# Patient Record
Sex: Female | Born: 1985 | Race: Asian | Hispanic: Yes | Marital: Married | Smoking: Never smoker
Health system: Southern US, Community
[De-identification: ages and names within clinical notes are randomized; demographics above are authoritative.]

## PROBLEM LIST (undated history)

## (undated) DIAGNOSIS — K219 Gastro-esophageal reflux disease without esophagitis: Secondary | ICD-10-CM

## (undated) DIAGNOSIS — M419 Scoliosis, unspecified: Secondary | ICD-10-CM

## (undated) DIAGNOSIS — K8020 Calculus of gallbladder without cholecystitis without obstruction: Secondary | ICD-10-CM

## (undated) DIAGNOSIS — Z789 Other specified health status: Secondary | ICD-10-CM

## (undated) HISTORY — PX: CHOLECYSTECTOMY: SHX55

## (undated) HISTORY — PX: NO PAST SURGERIES: SHX2092

---

## 2017-03-20 ENCOUNTER — Encounter (HOSPITAL_COMMUNITY): Payer: Self-pay | Admitting: Emergency Medicine

## 2017-03-20 ENCOUNTER — Ambulatory Visit (HOSPITAL_COMMUNITY)
Admission: EM | Admit: 2017-03-20 | Discharge: 2017-03-20 | Disposition: A | Discharge status: Home / Self Care | Attending: Family Medicine | Admitting: Family Medicine

## 2017-03-20 DIAGNOSIS — R519 Headache, unspecified: Secondary | ICD-10-CM

## 2017-03-20 DIAGNOSIS — J00 Acute nasopharyngitis [common cold]: Secondary | ICD-10-CM | POA: Diagnosis not present

## 2017-03-20 DIAGNOSIS — R0981 Nasal congestion: Secondary | ICD-10-CM | POA: Diagnosis not present

## 2017-03-20 DIAGNOSIS — R51 Headache: Secondary | ICD-10-CM

## 2017-03-20 MED ORDER — PREDNISONE 10 MG (21) PO TBPK: ORAL_TABLET | Freq: Every day | ORAL | 0 refills | Status: DC

## 2017-03-20 MED ORDER — KETOROLAC TROMETHAMINE 30 MG/ML IJ SOLN
INTRAMUSCULAR | Status: AC
  Filled 2017-03-20: qty 1

## 2017-03-20 MED ORDER — KETOROLAC TROMETHAMINE 30 MG/ML IJ SOLN
30.0000 mg | Freq: Once | INTRAMUSCULAR | Status: AC
  Administered 2017-03-20: 30 mg via INTRAMUSCULAR

## 2017-03-20 MED ORDER — FLUTICASONE PROPIONATE 50 MCG/ACT NA SUSP: 2.0000 | Freq: Every day | NASAL | 2 refills | Status: DC

## 2017-03-20 NOTE — ED Provider Notes (Signed)
CSN: [id number]     Arrival date & time 03/20/17  1907 History   First MD Initiated Contact with Patient 03/20/17 2003     Chief Complaint  Patient presents with  . Facial Pain   (Consider location/radiation/quality/duration/timing/severity/associated sxs/prior Treatment) Pt c/o generalized facial pain more so near sinus area, sore throat and headache. She states that she has had this in the past from a sinus infection. This time it has only been for 2 days. She states that she is breast feeding and not able to take many medications and has not taken any pta. Denies ay photophobia, no n/v/d, no weakness, alert.       History reviewed. No pertinent past medical history. Past Surgical History:  Procedure Laterality Date  . CHOLECYSTECTOMY     History reviewed. No pertinent family history. Social History  Substance Use Topics  . Smoking status: Never Smoker  . Smokeless tobacco: Never Used  . Alcohol use No   OB History    No data available     Review of Systems  Constitutional: Negative.   HENT: Positive for postnasal drip, rhinorrhea, sinus pain, sinus pressure, sneezing and sore throat.   Eyes: Negative.   Respiratory: Negative.   Cardiovascular: Negative.   Gastrointestinal: Negative.   Genitourinary: Negative.   Musculoskeletal: Negative.   Neurological: Positive for headaches.    Allergies  Aspirin  Home Medications   Prior to Admission medications   Not on File   Meds Ordered and Administered this Visit   Medications  ketorolac (TORADOL) 30 MG/ML injection 30 mg (not administered)    BP 111/69 (BP Location: Right Arm)   Pulse 81   Temp 98.5 F (36.9 C) (Oral)   Resp 18   SpO2 99%  No data found.   Physical Exam  Constitutional: She appears well-developed.  HENT:  Head: Normocephalic.  Right Ear: External ear normal.  Left Ear: External ear normal.  Mouth/Throat: Oropharynx is clear and moist.  clear post nasal drip   Eyes: Pupils are equal,  round, and reactive to light.  Neck: Normal range of motion.  Cardiovascular: Normal rate and regular rhythm.   Pulmonary/Chest: Effort normal and breath sounds normal.  Abdominal: Soft. Bowel sounds are normal.  Musculoskeletal: Normal range of motion.  Neurological: She is alert.  No aura, no photopia, no n/v/d has had a headache like this in the past.   Skin: Skin is warm.    Urgent Care Course     Procedures (including critical care time)  Labs Review Labs Reviewed - No data to display  Imaging Review No results found.           MDM   1. Sinus congestion   2. Acute nonintractable headache, unspecified headache type   3. Acute rhinitis    Since you are breast feeding there is not a lot of medications that are available to you  We gave you a shot for pain and she states the pain is better 4/10 If symptoms of your headache get worse such as blurred vision weakness to one side you will need to go to the emergency room Drink plenty of fluids  May use a netty pot to help with sinus pressure  May take tylenol as needed for pain   spoke with doc Dayton ScrapeMurray and she was ok with treatment plan. Pt was very hesitant on taking prednisone due to she became nausea prior while taking this. Given much education on this and pt was ok upon  d/c    Tobi Bastos, NP 03/22/17 [phone]

## 2017-03-20 NOTE — Discharge Instructions (Signed)
Since you are breast feeding there is not a lot of medications that are available to you  We will gave you a shot to help a If symptoms of your headache get worse such as blurred vision weakness to one side you will need to go to the emergency room Drink plenty of fluids  May use a netty pot to help with sinus pressure  May take tylenol as needed for pain

## 2017-03-20 NOTE — ED Triage Notes (Signed)
[facility] with a [facility] of a sore throat, bilateral otalgia and sinus pressure that started this am.

## 2017-04-19 ENCOUNTER — Ambulatory Visit (HOSPITAL_COMMUNITY)
Admission: EM | Admit: 2017-04-19 | Discharge: 2017-04-19 | Disposition: A | Discharge status: Home / Self Care | Attending: Family Medicine | Admitting: Family Medicine

## 2017-04-19 ENCOUNTER — Encounter (HOSPITAL_COMMUNITY): Payer: Self-pay | Admitting: Emergency Medicine

## 2017-04-19 DIAGNOSIS — R3915 Urgency of urination: Secondary | ICD-10-CM | POA: Diagnosis present

## 2017-04-19 DIAGNOSIS — Z9049 Acquired absence of other specified parts of digestive tract: Secondary | ICD-10-CM | POA: Insufficient documentation

## 2017-04-19 DIAGNOSIS — R300 Dysuria: Secondary | ICD-10-CM | POA: Diagnosis present

## 2017-04-19 DIAGNOSIS — N3000 Acute cystitis without hematuria: Secondary | ICD-10-CM | POA: Diagnosis not present

## 2017-04-19 DIAGNOSIS — B373 Candidiasis of vulva and vagina: Secondary | ICD-10-CM | POA: Diagnosis not present

## 2017-04-19 DIAGNOSIS — Z87440 Personal history of urinary (tract) infections: Secondary | ICD-10-CM | POA: Insufficient documentation

## 2017-04-19 DIAGNOSIS — Z3202 Encounter for pregnancy test, result negative: Secondary | ICD-10-CM | POA: Diagnosis not present

## 2017-04-19 DIAGNOSIS — N390 Urinary tract infection, site not specified: Secondary | ICD-10-CM | POA: Diagnosis present

## 2017-04-19 DIAGNOSIS — B3731 Acute candidiasis of vulva and vagina: Secondary | ICD-10-CM

## 2017-04-19 LAB — POCT URINALYSIS DIP (DEVICE)
Bilirubin Urine: NEGATIVE
Glucose, UA: NEGATIVE mg/dL
KETONES UR: NEGATIVE mg/dL
Nitrite: NEGATIVE
PH: 6.5 (ref 5.0–8.0)
PROTEIN: NEGATIVE mg/dL
Specific Gravity, Urine: 1.015 (ref 1.005–1.030)
Urobilinogen, UA: 0.2 mg/dL (ref 0.0–1.0)

## 2017-04-19 LAB — POCT PREGNANCY, URINE: PREG TEST UR: NEGATIVE

## 2017-04-19 MED ORDER — CEPHALEXIN 500 MG PO CAPS: 500.0000 mg | ORAL_CAPSULE | Freq: Four times a day (QID) | ORAL | 0 refills | Status: DC

## 2017-04-19 MED ORDER — PHENAZOPYRIDINE HCL 200 MG PO TABS: 200.0000 mg | ORAL_TABLET | Freq: Three times a day (TID) | ORAL | 0 refills | Status: DC | PRN

## 2017-04-19 MED ORDER — FLUCONAZOLE 200 MG PO TABS
ORAL_TABLET | ORAL | 0 refills | Status: DC
Start: 2017-04-19 — End: 2017-11-30

## 2017-04-19 NOTE — ED Notes (Signed)
Sent patient to bathroom with instructions for obtaining a dirty and clean urine

## 2017-04-19 NOTE — Discharge Instructions (Signed)
You are being treated today for a urinary tract infection. I have prescribed Keflex, take 1 tablet 4 times a day for 5 days. I have also prescribed Pyridium. Take 1 tablet a day 3 times a day for 2 days. Your urine will be sent for culture and you will be notified should any change in therapy be needed. Drink plenty of fluids and rest. Should your symptoms fail to resolve, follow up with your primary care provider or return to clinic.   I have also provided the contact information for 2 different clinics, the first [facility], contact him to establish for primary care. The second is the [facility] outpatient clinic, contact them to establish for gynecological care.

## 2017-04-19 NOTE — ED Triage Notes (Signed)
Pain with urination for 3 days.  Low abdominal pain, painful intercourse.

## 2017-04-19 NOTE — ED Provider Notes (Signed)
CSN: [id number]     Arrival date & time 04/19/17  1439 History   First MD Initiated Contact with Patient 04/19/17 1500     Chief Complaint  Patient presents with  . Urinary Tract Infection   (Consider location/radiation/quality/duration/timing/severity/associated sxs/prior Treatment) Valerie Frost is a 31 y.o. female with a past history of cholecystectomy, who presents [facility] with a chief complaint of dysuria, urgency, frequency, as well as vaginal itching, denies any vaginal discharge, last menstrual cycle 10/30/2016, denies possibility of pregnancy.   The history is provided by the patient.  Urinary Tract Infection  Pain quality:  Aching and burning Pain severity:  Moderate Onset quality:  Gradual Duration:  2 days Timing:  Constant Progression:  Unchanged Chronicity:  New Recent urinary tract infections: no   Relieved by:  None tried Worsened by:  Nothing Ineffective treatments:  None tried Urinary symptoms: discolored urine and frequent urination   Urinary symptoms: no bladder incontinence   Associated symptoms: nausea   Associated symptoms: no abdominal pain, no fever, no flank pain, no genital lesions, no vaginal discharge and no vomiting   Risk factors: sexually active   Risk factors: not pregnant, no recurrent urinary tract infections and no sexually transmitted infections     History reviewed. No pertinent past medical history. Past Surgical History:  Procedure Laterality Date  . CHOLECYSTECTOMY     No family history on file. Social History  Substance Use Topics  . Smoking status: Never Smoker  . Smokeless tobacco: Never Used  . Alcohol use No   OB History    No data available     Review of Systems  Constitutional: Negative for fever.  HENT: Negative.   Respiratory: Negative.   Cardiovascular: Negative.   Gastrointestinal: Positive for nausea. Negative for abdominal pain and vomiting.  Genitourinary: Positive for dysuria, frequency  and urgency. Negative for flank pain, genital sores, menstrual problem, pelvic pain and vaginal discharge.       Vaginal itching  Musculoskeletal: Negative.   Skin: Negative.   Neurological: Negative.     Allergies  Aspirin  Home Medications   Prior to Admission medications   Medication Sig Start Date End Date Taking? Authorizing Provider  cephALEXin (KEFLEX) 500 MG capsule Take 1 capsule (500 mg total) by mouth 4 (four) times daily. 04/19/17   Dorena Bodo, NP  fluconazole (DIFLUCAN) 200 MG tablet Take one tablet today, wait 3 days, take the second tablet 04/19/17   Dorena Bodo, NP  phenazopyridine (PYRIDIUM) 200 MG tablet Take 1 tablet (200 mg total) by mouth 3 (three) times daily as needed for pain. 04/19/17   Dorena Bodo, NP   Meds Ordered and Administered this Visit  Medications - No data to display  BP 106/66 (BP Location: Right Arm)   Pulse 80   Temp 98.5 F (36.9 C) (Oral)   Resp 16   LMP 03/30/2017   SpO2 100%  No data found.   Physical Exam  Constitutional: She is oriented to person, place, and time. She appears well-developed and well-nourished. No distress.  Eyes: Conjunctivae are normal.  Neck: Normal range of motion.  Cardiovascular: Normal rate and regular rhythm.   Pulmonary/Chest: Effort normal.  Abdominal: Soft. Bowel sounds are normal. She exhibits no distension and no mass. There is no tenderness. There is no guarding and no CVA tenderness.  Neurological: She is alert and oriented to person, place, and time.  Skin: Skin is warm and dry. Capillary refill takes less  than 2 seconds. She is not diaphoretic.  Psychiatric: She has a normal mood and affect. Her behavior is normal.  Nursing note and vitals reviewed.   Urgent Care Course     Procedures (including critical care time)  Labs Review Labs Reviewed  POCT URINALYSIS DIP (DEVICE) - Abnormal; Notable for the following:       Result Value   Hgb urine dipstick TRACE (*)     Leukocytes, UA SMALL (*)    All other components within normal limits  POCT PREGNANCY, URINE  URINE CYTOLOGY ANCILLARY ONLY    Imaging Review No results found.     MDM   1. Acute cystitis without hematuria   2. Yeast vaginitis      Valerie Frost is a 31 y.o. female here with urinary urgency, frequency, and dysuria, Along with vaginal itching.  Differential diagnosis for urinary symptoms includes, but is not limited to, UTI, pyelonephritis, ureterallithiasis, STI, reaction to hygiene products, and others.  VSS. Exam without any abdominal or CVAT. Pt is afebrile & has not been vomiting. UA is possibly sugestive of UTI. Will treat with Keflex and Pyridium. Urine cytology sent, and patient given Diflucan as well. Follow up with PCP in 3-5 days for recheck as needed. If symptoms worsen, develop back pain/fever/vomiting return to the urgent care, or go to the ER for further treatment.     Dorena BodoKennard, Tylique Aull, NP 04/19/17 1541

## 2017-04-20 LAB — URINE CYTOLOGY ANCILLARY ONLY
CHLAMYDIA, DNA PROBE: NEGATIVE
NEISSERIA GONORRHEA: NEGATIVE
Trichomonas: NEGATIVE

## 2017-05-23 ENCOUNTER — Encounter: Payer: Self-pay | Admitting: Family Medicine

## 2017-10-15 ENCOUNTER — Encounter: Payer: Self-pay | Admitting: Obstetrics and Gynecology

## 2017-11-02 ENCOUNTER — Encounter (HOSPITAL_COMMUNITY): Payer: Self-pay | Admitting: Emergency Medicine

## 2017-11-02 ENCOUNTER — Ambulatory Visit (HOSPITAL_COMMUNITY)
Admission: EM | Admit: 2017-11-02 | Discharge: 2017-11-02 | Disposition: A | Discharge status: Home / Self Care | Attending: Emergency Medicine | Admitting: Emergency Medicine

## 2017-11-02 DIAGNOSIS — R1032 Left lower quadrant pain: Secondary | ICD-10-CM

## 2017-11-02 DIAGNOSIS — R300 Dysuria: Secondary | ICD-10-CM | POA: Diagnosis not present

## 2017-11-02 DIAGNOSIS — Z9049 Acquired absence of other specified parts of digestive tract: Secondary | ICD-10-CM | POA: Insufficient documentation

## 2017-11-02 DIAGNOSIS — R102 Pelvic and perineal pain: Secondary | ICD-10-CM | POA: Insufficient documentation

## 2017-11-02 DIAGNOSIS — Z3202 Encounter for pregnancy test, result negative: Secondary | ICD-10-CM | POA: Diagnosis not present

## 2017-11-02 LAB — POCT URINALYSIS DIP (DEVICE)
BILIRUBIN URINE: NEGATIVE
Glucose, UA: NEGATIVE mg/dL
KETONES UR: NEGATIVE mg/dL
LEUKOCYTES UA: NEGATIVE
NITRITE: NEGATIVE
Protein, ur: NEGATIVE mg/dL
Specific Gravity, Urine: 1.015 (ref 1.005–1.030)
Urobilinogen, UA: 0.2 mg/dL (ref 0.0–1.0)
pH: 5.5 (ref 5.0–8.0)

## 2017-11-02 LAB — POCT PREGNANCY, URINE: Preg Test, Ur: NEGATIVE

## 2017-11-02 NOTE — ED Triage Notes (Signed)
PT reports pelvic pain, lower abdominal , dysuria for 1 week. PT reports fever 2 days ago.

## 2017-11-02 NOTE — ED Provider Notes (Signed)
[facility]    CSN: [id number] Arrival date & time: 11/02/17  1449     History   Chief Complaint Chief Complaint  Patient presents with  . Urinary Tract Infection    HPI Valerie Frost is a 32 y.o. female.   Here for lower pelvic pain since being on her menstrual cycle , low grade fever , nausea. Concern for uti sx, denies any possible risks of preg. Has only taken tylenol pta with minimal relief       History reviewed. No pertinent past medical history.  There are no active problems to display for this patient.   Past Surgical History:  Procedure Laterality Date  . CHOLECYSTECTOMY      OB History    No data available       Home Medications    Prior to Admission medications   Medication Sig Start Date End Date Taking? Authorizing Provider  cephALEXin (KEFLEX) 500 MG capsule Take 1 capsule (500 mg total) by mouth 4 (four) times daily. 04/19/17   Dorena BodoKennard, Lawrence, NP  fluconazole (DIFLUCAN) 200 MG tablet Take one tablet today, wait 3 days, take the second tablet 04/19/17   Dorena BodoKennard, Lawrence, NP  phenazopyridine (PYRIDIUM) 200 MG tablet Take 1 tablet (200 mg total) by mouth 3 (three) times daily as needed for pain. 04/19/17   Dorena BodoKennard, Lawrence, NP    Family History No family history on file.  Social History Social History   Tobacco Use  . Smoking status: Never Smoker  . Smokeless tobacco: Never Used  Substance Use Topics  . Alcohol use: No  . Drug use: No     Allergies   Aspirin   Review of Systems Review of Systems  Respiratory: Negative.   Cardiovascular: Negative.   Gastrointestinal: Negative.   Genitourinary: Positive for menstrual problem and pelvic pain.  Musculoskeletal: Negative.   Skin: Negative.   Neurological: Negative.      Physical Exam Triage Vital Signs ED Triage Vitals  Enc Vitals Group     BP 11/02/17 1532 105/71     Pulse Rate 11/02/17 1530 74     Resp 11/02/17 1530 16     Temp 11/02/17 1530 98.3 F (36.8 C)       Temp Source 11/02/17 1530 Oral     SpO2 11/02/17 1530 100 %     Weight 11/02/17 1531 147 lb (66.7 kg)     Height --      Head Circumference --      Peak Flow --      Pain Score 11/02/17 1532 8     Pain Loc --      Pain Edu? --      Excl. in GC? --    No data found.  Updated Vital Signs BP 105/71   Pulse 74   Temp 98.3 F (36.8 C) (Oral)   Resp 16   Wt 147 lb (66.7 kg)   LMP 10/31/2017   SpO2 100%   Visual Acuity Right Eye Distance:   Left Eye Distance:   Bilateral Distance:    Right Eye Near:   Left Eye Near:    Bilateral Near:     Physical Exam  Constitutional: She appears well-developed.  Cardiovascular: Normal rate and regular rhythm.  Pulmonary/Chest: Effort normal and breath sounds normal.  Abdominal: There is tenderness.  Neg for cv tenderness   Neurological: She is alert.  Skin: Skin is warm.     UC Treatments / Results  Labs (all labs  ordered are listed, but only abnormal results are displayed) Labs Reviewed  POCT URINALYSIS DIP (DEVICE) - Abnormal; Notable for the following components:      Result Value   Hgb urine dipstick LARGE (*)    All other components within normal limits  URINE CULTURE  POCT PREGNANCY, URINE    EKG  EKG Interpretation None       Radiology No results found.  Procedures Procedures (including critical care time)  Medications Ordered in UC Medications - No data to display   Initial Impression / Assessment and Plan / UC Course  I have reviewed the triage vital signs and the nursing notes.  Pertinent labs & imaging results that were available during my care of the patient were reviewed by me and considered in my medical decision making (see chart for details).     Your preg test was negative  We discussed you seeing an obgyn in feb  Discussed to call to see about an outpt ultrasound completed  You are currently on your menstrual cycle and this can cause some of the discomfort  Take the pain meds as  needed  Your uirne did not show any infection currently we will send this off for a culture   Final Clinical Impressions(s) / UC Diagnoses   Final diagnoses:  Dysuria  Left lower quadrant pain    ED Discharge Orders    None       Controlled Substance Prescriptions [place] Controlled Substance Registry consulted? Not Applicable   Coralyn Mark, NP 11/02/17 [phone]

## 2017-11-02 NOTE — Discharge Instructions (Signed)
Your preg test was negative  We discussed you seeing an obgyn in feb  Discussed to call to see about an outpt ultrasound completed  You are currently on your menstrual cycle and this can cause some of the discomfort  Take the pain meds as needed  Your uirne did not show any infection currently we will send this off for a culture  Due to your allergy to Asprin we are unable to prescribe any other pain meds cont to take the tylenol for pain

## 2017-11-04 LAB — URINE CULTURE: Culture: NO GROWTH

## 2017-11-30 ENCOUNTER — Other Ambulatory Visit: Payer: Self-pay

## 2017-11-30 ENCOUNTER — Inpatient Hospital Stay (HOSPITAL_COMMUNITY)

## 2017-11-30 ENCOUNTER — Encounter (HOSPITAL_COMMUNITY): Payer: Self-pay

## 2017-11-30 ENCOUNTER — Inpatient Hospital Stay (HOSPITAL_COMMUNITY)
Admission: AD | Admit: 2017-11-30 | Discharge: 2017-11-30 | Disposition: A | Discharge status: Home / Self Care | Source: Ambulatory Visit | Attending: Obstetrics and Gynecology | Admitting: Obstetrics and Gynecology

## 2017-11-30 DIAGNOSIS — Z886 Allergy status to analgesic agent status: Secondary | ICD-10-CM | POA: Diagnosis not present

## 2017-11-30 DIAGNOSIS — R102 Pelvic and perineal pain: Secondary | ICD-10-CM | POA: Insufficient documentation

## 2017-11-30 DIAGNOSIS — R51 Headache: Secondary | ICD-10-CM | POA: Insufficient documentation

## 2017-11-30 DIAGNOSIS — G43009 Migraine without aura, not intractable, without status migrainosus: Secondary | ICD-10-CM

## 2017-11-30 HISTORY — DX: Other specified health status: Z78.9

## 2017-11-30 LAB — WET PREP, GENITAL
CLUE CELLS WET PREP: NONE SEEN
SPERM: NONE SEEN
Trich, Wet Prep: NONE SEEN
Yeast Wet Prep HPF POC: NONE SEEN

## 2017-11-30 LAB — COMPREHENSIVE METABOLIC PANEL
ALBUMIN: 3.7 g/dL (ref 3.5–5.0)
ALK PHOS: 83 U/L (ref 38–126)
ALT: 26 U/L (ref 14–54)
ANION GAP: 9 (ref 5–15)
AST: 22 U/L (ref 15–41)
BUN: 11 mg/dL (ref 6–20)
CALCIUM: 9.1 mg/dL (ref 8.9–10.3)
CO2: 20 mmol/L — AB (ref 22–32)
Chloride: 107 mmol/L (ref 101–111)
Creatinine, Ser: 0.55 mg/dL (ref 0.44–1.00)
GFR calc Af Amer: >60 mL/min (ref >60)
GFR calc non Af Amer: >60 mL/min (ref >60)
GLUCOSE: 129 mg/dL — AB (ref 65–99)
Potassium: 3.7 mmol/L (ref 3.5–5.1)
SODIUM: 136 mmol/L (ref 135–145)
Total Bilirubin: 0.3 mg/dL (ref 0.3–1.2)
Total Protein: 6.8 g/dL (ref 6.5–8.1)

## 2017-11-30 LAB — CBC
HCT: 34.8 % — ABNORMAL LOW (ref 36.0–46.0)
HEMOGLOBIN: 11.6 g/dL — AB (ref 12.0–15.0)
MCH: 28.2 pg (ref 26.0–34.0)
MCHC: 33.3 g/dL (ref 30.0–36.0)
MCV: 84.7 fL (ref 78.0–100.0)
Platelets: 432 10*3/uL — ABNORMAL HIGH (ref 150–400)
RBC: 4.11 MIL/uL (ref 3.87–5.11)
RDW: 13.4 % (ref 11.5–15.5)
WBC: 9.6 10*3/uL (ref 4.0–10.5)

## 2017-11-30 LAB — URINALYSIS, ROUTINE W REFLEX MICROSCOPIC
Bilirubin Urine: NEGATIVE
Glucose, UA: NEGATIVE mg/dL
Ketones, ur: NEGATIVE mg/dL
Nitrite: NEGATIVE
PROTEIN: NEGATIVE mg/dL
Specific Gravity, Urine: 1.006 (ref 1.005–1.030)
pH: 6 (ref 5.0–8.0)

## 2017-11-30 LAB — POCT PREGNANCY, URINE: PREG TEST UR: NEGATIVE

## 2017-11-30 MED ORDER — DEXAMETHASONE SODIUM PHOSPHATE 10 MG/ML IJ SOLN
10.0000 mg | Freq: Once | INTRAMUSCULAR | Status: AC
  Administered 2017-11-30: 10 mg via INTRAVENOUS
  Filled 2017-11-30: qty 1

## 2017-11-30 MED ORDER — SODIUM CHLORIDE 0.9 % IV BOLUS (SEPSIS)
1000.0000 mL | Freq: Once | INTRAVENOUS | Status: AC
  Administered 2017-11-30: 1000 mL via INTRAVENOUS

## 2017-11-30 MED ORDER — DIPHENHYDRAMINE HCL 50 MG/ML IJ SOLN
25.0000 mg | Freq: Once | INTRAMUSCULAR | Status: AC
  Administered 2017-11-30: 25 mg via INTRAVENOUS
  Filled 2017-11-30: qty 1

## 2017-11-30 MED ORDER — IBUPROFEN 600 MG PO TABS: 600.0000 mg | ORAL_TABLET | Freq: Four times a day (QID) | ORAL | 1 refills | Status: AC | PRN

## 2017-11-30 MED ORDER — KETOROLAC TROMETHAMINE 60 MG/2ML IM SOLN: 60.0000 mg | Freq: Once | INTRAMUSCULAR | Status: DC

## 2017-11-30 MED ORDER — METOCLOPRAMIDE HCL 5 MG/ML IJ SOLN
10.0000 mg | Freq: Once | INTRAMUSCULAR | Status: AC
  Administered 2017-11-30: 10 mg via INTRAVENOUS
  Filled 2017-11-30: qty 2

## 2017-11-30 NOTE — MAU Provider Note (Signed)
History  CSN: [id number] Arrival date and time: 11/30/17 1532  Chief Complaint  Patient presents with  . Abdominal Pain  . Headache   HPI: Valerie Frost is a 32 yo G1P1001 woman who presents to MAU for complaint of uterine pain and headache.  The patient reports a history of uterine pain over the past month.  One month ago she had 4 days of vaginal bleeding and pinkish discharge when the pain started.  She describes the pain as sharp and that it comes and goes.  She says the pain occurs when she is lifting things or walking, but it can also occur while she is sitting doing nothing.  She reports feeling fever and chills over the past month intermittently.  Over the past 2 days she reports a headache that is located all across her head.  She says she has dizziness when she looks up and has had some nausea today.  She has tried ibuprofen and Tylenol for pain which help but do not resolve the pain.  She went to urgent care when these symptoms started because she thought she may have a UTI.  UA and culture were negative at that time.  OB History    Gravida Para Term Preterm AB Living   1 1 1     1    SAB TAB Ectopic Multiple Live Births                  Past Medical History:  Diagnosis Date  . Medical history non-contributory     Past Surgical History:  Procedure Laterality Date  . CHOLECYSTECTOMY    . NO PAST SURGERIES      History reviewed. No pertinent family history.  Social History   Tobacco Use  . Smoking status: Never Smoker  . Smokeless tobacco: Never Used  Substance Use Topics  . Alcohol use: No  . Drug use: No    Allergies:  Allergies  Allergen Reactions  . Aspirin Palpitations    Medications Prior to Admission  Medication Sig Dispense Refill Last Dose  . cephALEXin (KEFLEX) 500 MG capsule Take 1 capsule (500 mg total) by mouth 4 (four) times daily. 20 capsule 0   . fluconazole (DIFLUCAN) 200 MG tablet Take one tablet today, wait 3 days, take the second tablet 2  tablet 0   . phenazopyridine (PYRIDIUM) 200 MG tablet Take 1 tablet (200 mg total) by mouth 3 (three) times daily as needed for pain. 10 tablet 0     Review of Systems  Constitutional: Positive for chills, fatigue and fever.  Eyes: Positive for visual disturbance. Negative for photophobia.  Gastrointestinal: Positive for nausea. Negative for constipation, diarrhea and vomiting.  Genitourinary: Positive for frequency, pelvic pain and urgency. Negative for dysuria, hematuria, vaginal bleeding and vaginal discharge.  Neurological: Positive for dizziness and headaches.   Physical Exam   Blood pressure 112/69, pulse 82, temperature 98.3 F (36.8 C), temperature source Oral, resp. rate 18, weight 68.5 kg (151 lb), last menstrual period 10/31/2017, SpO2 99 %.  Physical Exam  Constitutional: She is oriented to person, place, and time. She appears well-developed and well-nourished.  HENT:  Head: Normocephalic and atraumatic.  Eyes: Conjunctivae and EOM are normal. No scleral icterus.  Neck: Normal range of motion.  Cardiovascular: Normal rate, regular rhythm and normal heart sounds. Exam reveals no gallop and no friction rub.  No murmur heard. Respiratory: Effort normal and breath sounds normal. No respiratory distress.  GI: Soft. Bowel sounds are normal. There  is tenderness.  Mild tenderness in LUQ  Genitourinary: Vagina normal and uterus normal.  Genitourinary Comments: No CMT.  Bilateral adnexal pain.  Musculoskeletal: She exhibits no edema.  Neurological: She is alert and oriented to person, place, and time. No cranial nerve deficit.  Skin: She is not diaphoretic.    MAU Course   Nursing notes and vital signs reviewed. Patient hemodynamically stable. Labs: Pregnancy POC, UA, CBC, CMP, Wet prep, GC/Ch NAAT [place] ordered  MDM: Pt with cervical motion tenderness and general discomfort with pelvic exam.  Wet prep normal, pelvic US normal so no clear etiology for pt pain.  GCC pending  but pt denies STI risks. Acute emergent causes for pain have been excluded.  Neuro exam wnl and h/a resolved with headache cocktail (NS, Reglan 10 mg IV, Benadryl 25 mg IV, and Decadron 10 mg IV).  Headache is likely migraine with associated dizziness and nausea. F/U with [facility] as scheduled for further eval of pelvic pain, f/u with PCP for migraine and concerns about diabetes. Serum glucose 129 today in CMP.     Assessment and Plan   1. Adnexal pain    Plan:  - Ibuprofen q6 PRN for pain, Rx for 600 mg Q 6 hours for h/a or pelvic pain - f/u in gyn clinic on 2/19 - Discharge in stable condition - Return precautions given  [name] 11/30/2017, 7:44 PM    I confirm that I have verified the information documented in the medical student's note and that I have also personally performed the physical exam and all medical decision making activities.  Sharen Counter, CNM 8:48 PM

## 2017-11-30 NOTE — MAU Note (Signed)
Been having strong pain in her uterus, going on for 4 wks.. Has a headache, getting worse and worse, today became dizzy.  Has only taken Tylenol.  [facility] was told she needed an US of her uterus to see what is going on, was told she could come here.

## 2017-12-03 LAB — GC/CHLAMYDIA PROBE AMP (~~LOC~~) NOT AT ARMC
Chlamydia: NEGATIVE
Neisseria Gonorrhea: NEGATIVE

## 2017-12-18 ENCOUNTER — Encounter: Admitting: Obstetrics and Gynecology

## 2019-09-22 ENCOUNTER — Emergency Department (HOSPITAL_COMMUNITY): Payer: BC Managed Care – PPO

## 2019-09-22 ENCOUNTER — Emergency Department (HOSPITAL_COMMUNITY)
Admission: EM | Admit: 2019-09-22 | Discharge: 2019-09-22 | Disposition: A | Discharge status: Home / Self Care | Payer: BC Managed Care – PPO | Attending: Emergency Medicine | Admitting: Emergency Medicine

## 2019-09-22 ENCOUNTER — Other Ambulatory Visit: Payer: Self-pay

## 2019-09-22 ENCOUNTER — Encounter (HOSPITAL_COMMUNITY): Payer: Self-pay

## 2019-09-22 DIAGNOSIS — N83201 Unspecified ovarian cyst, right side: Secondary | ICD-10-CM | POA: Diagnosis not present

## 2019-09-22 DIAGNOSIS — Y9389 Activity, other specified: Secondary | ICD-10-CM | POA: Diagnosis not present

## 2019-09-22 DIAGNOSIS — S060X9A Concussion with loss of consciousness of unspecified duration, initial encounter: Secondary | ICD-10-CM

## 2019-09-22 DIAGNOSIS — Y929 Unspecified place or not applicable: Secondary | ICD-10-CM | POA: Diagnosis not present

## 2019-09-22 DIAGNOSIS — W109XXA Fall (on) (from) unspecified stairs and steps, initial encounter: Secondary | ICD-10-CM | POA: Diagnosis not present

## 2019-09-22 DIAGNOSIS — N3001 Acute cystitis with hematuria: Secondary | ICD-10-CM | POA: Diagnosis not present

## 2019-09-22 DIAGNOSIS — S060X0A Concussion without loss of consciousness, initial encounter: Secondary | ICD-10-CM | POA: Diagnosis not present

## 2019-09-22 DIAGNOSIS — Y999 Unspecified external cause status: Secondary | ICD-10-CM | POA: Insufficient documentation

## 2019-09-22 DIAGNOSIS — S0003XA Contusion of scalp, initial encounter: Secondary | ICD-10-CM | POA: Diagnosis not present

## 2019-09-22 DIAGNOSIS — S0990XA Unspecified injury of head, initial encounter: Secondary | ICD-10-CM | POA: Diagnosis present

## 2019-09-22 LAB — CBC WITH DIFFERENTIAL/PLATELET
Abs Immature Granulocytes: 0.03 10*3/uL (ref 0.00–0.07)
Basophils Absolute: 0 10*3/uL (ref 0.0–0.1)
Basophils Relative: 0 %
Eosinophils Absolute: 0.1 10*3/uL (ref 0.0–0.5)
Eosinophils Relative: 1 %
HCT: 40.2 % (ref 36.0–46.0)
Hemoglobin: 12.9 g/dL (ref 12.0–15.0)
Immature Granulocytes: 0 %
Lymphocytes Relative: 28 %
Lymphs Abs: 2.9 10*3/uL (ref 0.7–4.0)
MCH: 27.9 pg (ref 26.0–34.0)
MCHC: 32.1 g/dL (ref 30.0–36.0)
MCV: 87 fL (ref 80.0–100.0)
Monocytes Absolute: 0.6 10*3/uL (ref 0.1–1.0)
Monocytes Relative: 5 %
Neutro Abs: 6.6 10*3/uL (ref 1.7–7.7)
Neutrophils Relative %: 66 %
Platelets: 482 10*3/uL — ABNORMAL HIGH (ref 150–400)
RBC: 4.62 MIL/uL (ref 3.87–5.11)
RDW: 13.8 % (ref 11.5–15.5)
WBC: 10.2 10*3/uL (ref 4.0–10.5)
nRBC: 0 % (ref 0.0–0.2)

## 2019-09-22 LAB — COMPREHENSIVE METABOLIC PANEL
ALT: 42 U/L (ref 0–44)
AST: 28 U/L (ref 15–41)
Albumin: 4 g/dL (ref 3.5–5.0)
Alkaline Phosphatase: 94 U/L (ref 38–126)
Anion gap: 8 (ref 5–15)
BUN: 12 mg/dL (ref 6–20)
CO2: 24 mmol/L (ref 22–32)
Calcium: 9.2 mg/dL (ref 8.9–10.3)
Chloride: 107 mmol/L (ref 98–111)
Creatinine, Ser: 0.62 mg/dL (ref 0.44–1.00)
GFR calc Af Amer: >60 mL/min (ref >60)
GFR calc non Af Amer: >60 mL/min (ref >60)
Glucose, Bld: 96 mg/dL (ref 70–99)
Potassium: 3.8 mmol/L (ref 3.5–5.1)
Sodium: 139 mmol/L (ref 135–145)
Total Bilirubin: 0.7 mg/dL (ref 0.3–1.2)
Total Protein: 7.8 g/dL (ref 6.5–8.1)

## 2019-09-22 LAB — URINALYSIS, ROUTINE W REFLEX MICROSCOPIC
Bilirubin Urine: NEGATIVE
Glucose, UA: NEGATIVE mg/dL
Ketones, ur: NEGATIVE mg/dL
Nitrite: NEGATIVE
Protein, ur: NEGATIVE mg/dL
Specific Gravity, Urine: >1.046 — ABNORMAL HIGH (ref 1.005–1.030)
WBC, UA: >50 WBC/hpf — ABNORMAL HIGH (ref 0–5)
pH: 6 (ref 5.0–8.0)

## 2019-09-22 LAB — I-STAT BETA HCG BLOOD, ED (MC, WL, AP ONLY): I-stat hCG, quantitative: <5 m[IU]/mL (ref <5)

## 2019-09-22 MED ORDER — CEPHALEXIN 500 MG PO CAPS: 500.0000 mg | ORAL_CAPSULE | Freq: Three times a day (TID) | ORAL | 0 refills | Status: AC

## 2019-09-22 MED ORDER — ACETAMINOPHEN 325 MG PO TABS
650.0000 mg | ORAL_TABLET | Freq: Once | ORAL | Status: AC
  Administered 2019-09-22: 650 mg via ORAL
  Filled 2019-09-22: qty 2

## 2019-09-22 MED ORDER — SODIUM CHLORIDE (PF) 0.9 % IJ SOLN
INTRAMUSCULAR | Status: AC
  Administered 2019-09-22: 20:00:00
  Filled 2019-09-22: qty 50

## 2019-09-22 MED ORDER — SODIUM CHLORIDE 0.9 % IV SOLN
1.0000 g | Freq: Once | INTRAVENOUS | Status: AC
  Administered 2019-09-22: 1 g via INTRAVENOUS
  Filled 2019-09-22: qty 10

## 2019-09-22 MED ORDER — IOHEXOL 300 MG/ML  SOLN
100.0000 mL | Freq: Once | INTRAMUSCULAR | Status: AC | PRN
  Administered 2019-09-22: 18:00:00 100 mL via INTRAVENOUS

## 2019-09-22 MED ORDER — NAPROXEN 500 MG PO TABS: 500.0000 mg | ORAL_TABLET | Freq: Two times a day (BID) | ORAL | 0 refills | Status: AC | PRN

## 2019-09-22 NOTE — ED Notes (Signed)
Ovid Curd in lab verbalizing processing urine culture at present.

## 2019-09-22 NOTE — ED Notes (Addendum)
Pt was verbalized discharge instructions. Pt had no further questions at this time. NAD. Pt will be discharged after antibiotics are finished.

## 2019-09-22 NOTE — ED Provider Notes (Signed)
[facility]-EMERGENCY DEPT Provider Note   CSN: [id number] Arrival date & time: 09/22/19  1059     History   Chief Complaint Chief Complaint  Patient presents with  . Fall    HPI Valerie Frost is a 33 y.o. female.      Patient here with headache tiredness after hitting her head 4 days ago.  States she slipped on 1 carpeted steps and hit the back of her head but did not lose consciousness.  She has had a little bit of a headache preceding this.  Since then she has had a worsening headache with nausea and increased fatigue and sleepiness.  She has had intermittent blurry vision and nausea but no vomiting.  No focal weakness, numbness or tingling.  No fever.  Complains of some lower right quadrant abdominal pain as well.  She is currently on her menstrual cycle.  She had a C-section in February.  Reports she has never had this abdominal pain before but is constant and cramping in her right lower quadrant.  She does admit to lifting something heavy several days prior to the fall.  Pain is worse with palpation.  Is been no vomiting, diarrhea, unusual vaginal bleeding or discharge.  No pain with urination or blood in the urine. Previous cholecystectomy. Denies any visual changes currently.  She took Tylenol at home with partial relief.  The history is provided by the patient.  Fall Associated symptoms include abdominal pain and headaches. Pertinent negatives include no chest pain and no shortness of breath.    Past Medical History:  Diagnosis Date  . Medical history non-contributory     There are no active problems to display for this patient.   Past Surgical History:  Procedure Laterality Date  . CHOLECYSTECTOMY    . NO PAST SURGERIES       OB History    Gravida  1   Para  1   Term  1   Preterm      AB      Living  1     SAB      TAB      Ectopic      Multiple      Live Births               Home Medications    Prior to Admission  medications   Medication Sig Start Date End Date Taking? Authorizing Provider  ibuprofen (ADVIL,MOTRIN) 600 MG tablet Take 1 tablet (600 mg total) by mouth every 6 (six) hours as needed. 11/30/17   Leftwich-Kirby, Wilmer Floor, CNM    Family History History reviewed. No pertinent family history.  Social History Social History   Tobacco Use  . Smoking status: Never Smoker  . Smokeless tobacco: Never Used  Substance Use Topics  . Alcohol use: No  . Drug use: No     Allergies   Aspirin   Review of Systems Review of Systems  Constitutional: Negative for activity change, appetite change and fever.  HENT: Negative for congestion and rhinorrhea.   Eyes: Negative for visual disturbance.  Respiratory: Negative for cough, chest tightness and shortness of breath.   Cardiovascular: Negative for chest pain.  Gastrointestinal: Positive for abdominal pain and nausea. Negative for vomiting.  Genitourinary: Positive for vaginal bleeding. Negative for dysuria, hematuria and vaginal discharge.  Musculoskeletal: Negative for arthralgias and myalgias.  Skin: Negative for rash.  Neurological: Positive for dizziness, light-headedness and headaches. Negative for weakness.    all  other systems are negative except as noted in the HPI and PMH.    Physical Exam Updated Vital Signs BP 119/81 (BP Location: Left Arm)   Pulse 81   Temp 98.8 F (37.1 C) (Oral)   Resp 18   Ht 5\' 4"  (1.626 m)   Wt 78.5 kg   LMP 09/22/2019 (Approximate)   SpO2 99%   BMI 29.70 kg/m   Physical Exam Vitals signs and nursing note reviewed.  Constitutional:      General: She is not in acute distress.    Appearance: She is well-developed. She is obese.  HENT:     Head: Normocephalic and atraumatic.     Comments: No septal hematoma or hemotympanum. Hematoma to posterior occiput    Mouth/Throat:     Pharynx: No oropharyngeal exudate.  Eyes:     Conjunctiva/sclera: Conjunctivae normal.     Pupils: Pupils are equal,  round, and reactive to light.  Neck:     Musculoskeletal: Normal range of motion and neck supple.     Comments: No C-spine tenderness Cardiovascular:     Rate and Rhythm: Normal rate and regular rhythm.     Heart sounds: Normal heart sounds. No murmur.  Pulmonary:     Effort: Pulmonary effort is normal. No respiratory distress.     Breath sounds: Normal breath sounds.  Chest:     Chest wall: No tenderness.  Abdominal:     Palpations: Abdomen is soft.     Tenderness: There is abdominal tenderness. There is no guarding or rebound.     Comments: Right lower quadrant and suprapubic tenderness with voluntary guarding.  No rebound.  No peritoneal signs.  Musculoskeletal: Normal range of motion.        General: No tenderness.  Skin:    General: Skin is warm.  Neurological:     Mental Status: She is alert and oriented to person, place, and time.     Cranial Nerves: No cranial nerve deficit.     Motor: No abnormal muscle tone.     Coordination: Coordination normal.     Comments: CN 2-12 intact, no ataxia on finger to nose, no nystagmus, 5/5 strength throughout, no pronator drift, Romberg negative, normal gait.   Psychiatric:        Behavior: Behavior normal.      ED Treatments / Results  Labs (all labs ordered are listed, but only abnormal results are displayed) Labs Reviewed  CBC WITH DIFFERENTIAL/PLATELET - Abnormal; Notable for the following components:      Result Value   Platelets 482 (*)    All other components within normal limits  URINALYSIS, ROUTINE W REFLEX MICROSCOPIC - Abnormal; Notable for the following components:   APPearance CLOUDY (*)    Specific Gravity, Urine >1.046 (*)    Hgb urine dipstick LARGE (*)    Leukocytes,Ua LARGE (*)    WBC, UA >50 (*)    Bacteria, UA RARE (*)    All other components within normal limits  URINE CULTURE  COMPREHENSIVE METABOLIC PANEL  I-STAT BETA HCG BLOOD, ED ([facility], [facility], [facility] ONLY)    EKG None  Radiology Ct Head Wo Contrast   Result Date: 09/22/2019 CLINICAL DATA:  Fall on Friday.  Persistent headache. EXAM: CT HEAD WITHOUT CONTRAST TECHNIQUE: Contiguous axial images were obtained from the base of the skull through the vertex without intravenous contrast. COMPARISON:  None. FINDINGS: Brain: No mass lesion, hemorrhage, hydrocephalus, acute infarct, intra-axial, or extra-axial fluid collection. Vascular: No hyperdense vessel or  unexpected calcification. Skull: No skull fracture. Sinuses/Orbits: Normal imaged portions of the orbits and globes. Other: None. IMPRESSION: Normal head CT. Electronically Signed   By: Abigail Miyamoto M.D.   On: 09/22/2019 17:56   Ct Abdomen Pelvis W Contrast  Result Date: 09/22/2019 CLINICAL DATA:  Abdominal pain. Appendicitis suspected. Recent fall. EXAM: CT ABDOMEN AND PELVIS WITH CONTRAST TECHNIQUE: Multidetector CT imaging of the abdomen and pelvis was performed using the standard protocol following bolus administration of intravenous contrast. CONTRAST:  172mL OMNIPAQUE IOHEXOL 300 MG/ML  SOLN COMPARISON:  Pelvic ultrasound 12/18/2017. FINDINGS: Lower chest: Motion degradation. Clear lung bases. No basilar pneumothorax. Trace left larger than right pleural effusions. Normal heart size. Hepatobiliary: Normal liver. Cholecystectomy, without biliary ductal dilatation. Pancreas: Normal, without mass or ductal dilatation. Spleen: Normal in size, without focal abnormality. Adrenals/Urinary Tract: Normal adrenal glands. Normal kidneys, without hydronephrosis. Normal urinary bladder. Stomach/Bowel: Normal stomach, without wall thickening. Colonic stool burden suggests constipation. Normal terminal ileum. Normal appendix, including on 62/3 and coronal image 67. Normal small bowel. No free intraperitoneal air. Vascular/Lymphatic: Normal caliber of the aorta and branch vessels. Retroaortic left renal vein. No abdominopelvic adenopathy. Reproductive: Normal uterus. Right ovarian fluid density lesion of 3.4 x 2.9 cm  is most consistent likely a cyst. Other: Trace free pelvic fluid is likely physiologic. Musculoskeletal: No acute osseous abnormality. IMPRESSION: 1. Normal appendix. 2.  No acute process in the abdomen or pelvis. 3.  Possible constipation. 4. Trace bilateral pleural effusions. 5. Right ovarian fluid density lesion, likely a cyst. If the symptoms could be attributed to the right adnexa, consider pelvic ultrasound. 6.  Trace free pelvic fluid is likely physiologic. Electronically Signed   By: Abigail Miyamoto M.D.   On: 09/22/2019 18:03    Procedures Procedures (including critical care time)  Medications Ordered in ED Medications - No data to display   Initial Impression / Assessment and Plan / ED Course  I have reviewed the triage vital signs and the nursing notes.  Pertinent labs & imaging results that were available during my care of the patient were reviewed by me and considered in my medical decision making (see chart for details).       Headache with nausea and fatigue since hitting her head 3 days ago.  No loss of consciousness.  Neurological exam is intact.  Low suspicion for serious intracranial injury.  Also complains of right-sided lower abdominal pain worsening for the past several days.  Still has an appendix.  She is currently on her menses  Work-up is remarkable for urinary tract infection.  Appendix is normal on CT scan but does show right-sided ovarian cyst which is likely responsible for her pain.  Patient appears comfortable and in no distress.  Low suspicion for ovarian torsion. She is offered ultrasound today which she prefers not to do and wishes to follow-up with gynecology.  Discussed diagnosis of concussion and the fact that headaches, dizziness, nausea may persist for several weeks. She needs to establish care with PCP as well as gynecology.  We will treat UTI.  Return precautions discussed. Final Clinical Impressions(s) / ED Diagnoses   Final diagnoses:   Concussion with loss of consciousness, initial encounter  Cyst of right ovary  Acute cystitis with hematuria    ED Discharge Orders         Ordered    cephALEXin (KEFLEX) 500 MG capsule  3 times daily     09/22/19 1941    naproxen (NAPROSYN) 500 MG tablet  2 times daily PRN     09/22/19 1941           Glynn Octave, MD 09/23/19 [phone]

## 2019-09-22 NOTE — Discharge Instructions (Signed)
Keep yourself hydrated.  Take the antibiotic as prescribed for urinary tract infection.  Follow-up with a gynecologist as well as primary care physician.  You Declined ultrasound of your ovaries today.  Return to the ED if you have worsening pain, fever, vomiting, any other concerns.

## 2019-09-22 NOTE — ED Notes (Signed)
Called from lobby x1 to update v/s with no answer. 

## 2019-09-22 NOTE — ED Triage Notes (Addendum)
Pt presents with c/o fall that occurred on Friday night. Pt reports she fell and hit the back of her head, no LOC, dizziness at the time of the fall. Pt reports her head is still hurting from the fall and she feels sleepy still. Pt reports she is also having some pain in her lower abdomen but reports she was lifting something heavy the day prior to the fall. Pt does report c-section 2/28. Pt also c/o nausea.

## 2019-09-22 NOTE — ED Notes (Signed)
Pt to restroom to attempt urine sample.

## 2019-09-23 LAB — URINE CULTURE

## 2019-12-17 ENCOUNTER — Ambulatory Visit: Payer: BC Managed Care – PPO | Admitting: Physician Assistant

## 2019-12-29 ENCOUNTER — Emergency Department (HOSPITAL_COMMUNITY)
Admission: EM | Admit: 2019-12-29 | Discharge: 2019-12-29 | Disposition: A | Discharge status: Home / Self Care | Payer: BC Managed Care – PPO | Attending: Emergency Medicine | Admitting: Emergency Medicine

## 2019-12-29 ENCOUNTER — Emergency Department (HOSPITAL_COMMUNITY): Payer: BC Managed Care – PPO

## 2019-12-29 ENCOUNTER — Other Ambulatory Visit: Payer: Self-pay

## 2019-12-29 ENCOUNTER — Emergency Department (HOSPITAL_COMMUNITY)
Admission: EM | Admit: 2019-12-29 | Discharge: 2019-12-29 | Discharge status: Against Medical Advice | Payer: BC Managed Care – PPO

## 2019-12-29 DIAGNOSIS — Z79899 Other long term (current) drug therapy: Secondary | ICD-10-CM | POA: Diagnosis not present

## 2019-12-29 DIAGNOSIS — R1084 Generalized abdominal pain: Secondary | ICD-10-CM

## 2019-12-29 DIAGNOSIS — M25561 Pain in right knee: Secondary | ICD-10-CM

## 2019-12-29 DIAGNOSIS — R1030 Lower abdominal pain, unspecified: Secondary | ICD-10-CM | POA: Insufficient documentation

## 2019-12-29 HISTORY — DX: Gastro-esophageal reflux disease without esophagitis: K21.9

## 2019-12-29 HISTORY — DX: Scoliosis, unspecified: M41.9

## 2019-12-29 HISTORY — DX: Calculus of gallbladder without cholecystitis without obstruction: K80.20

## 2019-12-29 LAB — CBC
HCT: 38.1 % (ref 36.0–46.0)
Hemoglobin: 12.3 g/dL (ref 12.0–15.0)
MCH: 28.4 pg (ref 26.0–34.0)
MCHC: 32.3 g/dL (ref 30.0–36.0)
MCV: 88 fL (ref 80.0–100.0)
Platelets: 437 10*3/uL — ABNORMAL HIGH (ref 150–400)
RBC: 4.33 MIL/uL (ref 3.87–5.11)
RDW: 12.9 % (ref 11.5–15.5)
WBC: 9.8 10*3/uL (ref 4.0–10.5)
nRBC: 0 % (ref 0.0–0.2)

## 2019-12-29 LAB — COMPREHENSIVE METABOLIC PANEL
ALT: 28 U/L (ref 0–44)
AST: 20 U/L (ref 15–41)
Albumin: 3.9 g/dL (ref 3.5–5.0)
Alkaline Phosphatase: 98 U/L (ref 38–126)
Anion gap: 6 (ref 5–15)
BUN: 13 mg/dL (ref 6–20)
CO2: 25 mmol/L (ref 22–32)
Calcium: 8.9 mg/dL (ref 8.9–10.3)
Chloride: 109 mmol/L (ref 98–111)
Creatinine, Ser: 0.75 mg/dL (ref 0.44–1.00)
GFR calc Af Amer: >60 mL/min (ref >60)
GFR calc non Af Amer: >60 mL/min (ref >60)
Glucose, Bld: 105 mg/dL — ABNORMAL HIGH (ref 70–99)
Potassium: 3.7 mmol/L (ref 3.5–5.1)
Sodium: 140 mmol/L (ref 135–145)
Total Bilirubin: <0.1 mg/dL — ABNORMAL LOW (ref 0.3–1.2)
Total Protein: 7.2 g/dL (ref 6.5–8.1)

## 2019-12-29 LAB — LIPASE, BLOOD: Lipase: 26 U/L (ref 11–51)

## 2019-12-29 LAB — I-STAT BETA HCG BLOOD, ED (MC, WL, AP ONLY): I-stat hCG, quantitative: <5 m[IU]/mL (ref <5)

## 2019-12-29 MED ORDER — SODIUM CHLORIDE 0.9 % IV BOLUS
500.0000 mL | Freq: Once | INTRAVENOUS | Status: AC
  Administered 2019-12-29: 500 mL via INTRAVENOUS

## 2019-12-29 MED ORDER — TRAMADOL HCL 50 MG PO TABS
50.0000 mg | ORAL_TABLET | Freq: Once | ORAL | Status: AC
  Administered 2019-12-29: 50 mg via ORAL
  Filled 2019-12-29: qty 1

## 2019-12-29 MED ORDER — SODIUM CHLORIDE (PF) 0.9 % IJ SOLN
INTRAMUSCULAR | Status: AC
  Filled 2019-12-29: qty 50

## 2019-12-29 MED ORDER — IOHEXOL 300 MG/ML  SOLN
100.0000 mL | Freq: Once | INTRAMUSCULAR | Status: AC | PRN
  Administered 2019-12-29: 100 mL via INTRAVENOUS

## 2019-12-29 MED ORDER — TRAMADOL HCL 50 MG PO TABS: 50.0000 mg | ORAL_TABLET | Freq: Four times a day (QID) | ORAL | 0 refills | Status: AC | PRN

## 2019-12-29 MED ORDER — SODIUM CHLORIDE 0.9% FLUSH: 3.0000 mL | Freq: Once | INTRAVENOUS | Status: DC

## 2019-12-29 NOTE — ED Notes (Signed)
Pt ambulated to RR without assistance.  Unable to provide urine sample at this time.

## 2019-12-29 NOTE — Discharge Instructions (Addendum)
Use a heating pad on the sore areas of your abdomen and right knee as needed for comfort.  Use Tylenol if needed for pain, and the narcotic pain pill which was prescribed, if needed.  Do not drive, when taking narcotic pain reliever.  Follow-up with the doctor of your choice if not better in 1 week.

## 2019-12-29 NOTE — ED Notes (Signed)
The pt notified staff that she is leaving, staff cut her armband and shredded her labels.

## 2019-12-29 NOTE — ED Provider Notes (Signed)
[facility]-EMERGENCY DEPT Provider Note   CSN: [id number] Arrival date & time: 12/29/19  1956     History Chief Complaint  Patient presents with  . Post-op Problem  . Groin Pain  . Abdominal Pain    Valerie Frost is a 34 y.o. female.  HPI She presents for evaluation of pain, in her umbilicus and her left groin.  Pain has been ongoing since surgery for bilateral tubal ligation, November 14, 2019.  She denies nausea, vomiting, fever, chills, dysuria, urinary frequency or vaginal discharge.  She is currently on her menses, second day.  She does not have any concerns for STDs, is monogamous with her husband.  Her gynecologic surgery was done in [place].  She denies constipation.  She also complains of ongoing pain in her right knee, without trauma.  There are no other known modifying factors.    Past Medical History:  Diagnosis Date  . Gallstones   . GERD (gastroesophageal reflux disease)   . Scoliosis     There are no problems to display for this patient.   Past Surgical History:  Procedure Laterality Date  . CHOLECYSTECTOMY       OB History    Gravida  1   Para  1   Term  1   Preterm      AB      Living  1     SAB      TAB      Ectopic      Multiple      Live Births              No family history on file.  Social History   Tobacco Use  . Smoking status: Never Smoker  . Smokeless tobacco: Never Used  Substance Use Topics  . Alcohol use: No  . Drug use: No    Home Medications Prior to Admission medications   Medication Sig Start Date End Date Taking? Authorizing Provider  ibuprofen (ADVIL) 200 MG tablet Take 400 mg by mouth every 6 (six) hours as needed for fever, headache or moderate pain.   Yes [provider]  pantoprazole (PROTONIX) 40 MG tablet Take 40 mg by mouth daily as needed (indigestion).  11/13/19  Yes [provider]  cephALEXin (KEFLEX) 500 MG capsule Take 1 capsule (500 mg total) by mouth 3  (three) times daily. Patient not taking: Reported on 12/29/2019 09/22/19   Glynn Octave, MD  ibuprofen (ADVIL,MOTRIN) 600 MG tablet Take 1 tablet (600 mg total) by mouth every 6 (six) hours as needed. Patient not taking: Reported on 12/29/2019 11/30/17   Sharen Counter A, CNM  naproxen (NAPROSYN) 500 MG tablet Take 1 tablet (500 mg total) by mouth 2 (two) times daily as needed. Patient not taking: Reported on 12/29/2019 09/22/19   Rancour, Jeannett Senior, MD  traMADol (ULTRAM) 50 MG tablet Take 1 tablet (50 mg total) by mouth every 6 (six) hours as needed for moderate pain. 12/29/19   Mancel Bale, MD    Allergies    Aspirin  Review of Systems   Review of Systems  All other systems reviewed and are negative.   Physical Exam Updated Vital Signs BP 106/75   Pulse 71   Temp 98.5 F (36.9 C) (Oral)   Resp 18   SpO2 100%   Physical Exam Vitals and nursing note reviewed.  Constitutional:      General: She is not in acute distress.    Appearance: She is well-developed. She  is not ill-appearing, toxic-appearing or diaphoretic.  HENT:     Head: Normocephalic and atraumatic.  Eyes:     Conjunctiva/sclera: Conjunctivae normal.     Pupils: Pupils are equal, round, and reactive to light.  Neck:     Trachea: Phonation normal.  Cardiovascular:     Rate and Rhythm: Normal rate and regular rhythm.  Pulmonary:     Effort: Pulmonary effort is normal.     Breath sounds: Normal breath sounds.  Chest:     Chest wall: No tenderness.  Abdominal:     General: There is no distension.     Palpations: Abdomen is soft. There is no mass.     Tenderness: There is abdominal tenderness (Mild periumbilical pain, and mild left inguinal pain.  No palpable hernia.  Healed abdominal endoscopy incisions are present.). There is no guarding or rebound.     Hernia: No hernia is present.     Comments: No distention, no suggestion for ascites.  Musculoskeletal:        General: Normal range of motion.      Cervical back: Normal range of motion and neck supple.     Comments: Mild tenderness right knee, without effusion, instability or altered motion.  Skin:    General: Skin is warm and dry.  Neurological:     Mental Status: She is alert and oriented to person, place, and time.     Motor: No abnormal muscle tone.  Psychiatric:        Mood and Affect: Mood normal.        Behavior: Behavior normal.        Thought Content: Thought content normal.        Judgment: Judgment normal.     ED Results / Procedures / Treatments   Labs (all labs ordered are listed, but only abnormal results are displayed) Labs Reviewed  COMPREHENSIVE METABOLIC PANEL - Abnormal; Notable for the following components:      Result Value   Glucose, Bld 105 (*)    Total Bilirubin <0.1 (*)    All other components within normal limits  CBC - Abnormal; Notable for the following components:   Platelets 437 (*)    All other components within normal limits  LIPASE, BLOOD  URINALYSIS, ROUTINE W REFLEX MICROSCOPIC  I-STAT BETA HCG BLOOD, ED ([facility], [facility], [facility] ONLY)    EKG None  Radiology CT Abdomen Pelvis W Contrast  Result Date: 12/29/2019 CLINICAL DATA:  34 year old female with concern for abdominal abscess or infection. EXAM: CT ABDOMEN AND PELVIS WITH CONTRAST TECHNIQUE: Multidetector CT imaging of the abdomen and pelvis was performed using the standard protocol following bolus administration of intravenous contrast. CONTRAST:  OMNIPAQUE IOHEXOL 300 MG/ML  SOLN COMPARISON:  CT abdomen pelvis dated 09/22/2019. FINDINGS: Lower chest: Partially visualized trace bilateral pleural effusions. The visualized lung bases are clear. No intra-abdominal free air or free fluid. Hepatobiliary: The liver is unremarkable. No intrahepatic biliary ductal dilatation. Cholecystectomy. Pancreas: Unremarkable. No pancreatic ductal dilatation or surrounding inflammatory changes. Spleen: Normal in size without focal abnormality. Adrenals/Urinary  Tract: The adrenal glands are unremarkable. The kidneys, visualized ureters, and urinary bladder appear unremarkable as well. Stomach/Bowel: There is moderate stool throughout the colon. There is no bowel obstruction or active inflammation. The appendix is normal as visualized. Vascular/Lymphatic: The abdominal aorta and IVC are unremarkable. Retroaortic left renal vein anatomy. No portal venous gas. There is no adenopathy. Reproductive: The uterus and ovaries are grossly unremarkable. Other: Small fat containing umbilical  hernia. Musculoskeletal: No acute or significant osseous findings. IMPRESSION: 1. No acute intra-abdominal or pelvic pathology. No bowel obstruction. Normal appendix. 2. Partially visualized trace bilateral pleural effusions. Electronically Signed   By: Anner Crete M.D.   On: 12/29/2019 22:51    Procedures Procedures (including critical care time)  Medications Ordered in ED Medications  sodium chloride flush (NS) 0.9 % injection 3 mL (has no administration in time range)  sodium chloride (PF) 0.9 % injection (has no administration in time range)  traMADol (ULTRAM) tablet 50 mg (has no administration in time range)  sodium chloride 0.9 % bolus 500 mL (0 mLs Intravenous Stopped 12/29/19 2235)  iohexol (OMNIPAQUE) 300 MG/ML solution 100 mL (100 mLs Intravenous Contrast Given 12/29/19 2239)    ED Course  I have reviewed the triage vital signs and the nursing notes.  Pertinent labs & imaging results that were available during my care of the patient were reviewed by me and considered in my medical decision making (see chart for details).  Clinical Course as of Dec 28 2321  Mon Dec 29, 2019  2226 Normal  I-Stat beta hCG blood, ED [EW]  2226 Normal except glucose high, total bilirubin low  Comprehensive metabolic panel(!) [EW]  5573 Normal  Lipase, blood [EW]  2226 Normal except platelets high  CBC(!) [EW]  2313 Per radiologist, no acute intra-abdominal processes.   [EW]     Clinical Course User Index [EW] Daleen Bo, MD   MDM Rules/Calculators/A&P                       Patient Vitals for the past 24 hrs:  BP Temp Temp src Pulse Resp SpO2  12/29/19 2200 106/75 -- -- 71 18 100 %  12/29/19 2130 102/66 -- -- 74 16 100 %  12/29/19 2022 108/83 98.5 F (36.9 C) Oral 96 -- 99 %    11:19 PM Reevaluation with update and discussion. After initial assessment and treatment, an updated evaluation reveals no change in clinical status.  Findings discussed with the patient.  She states she has tried Tylenol for pain in her knee and abdomen is not helped and she needs something stronger.  Tramadol ordered.  Findings discussed and questions answered. Daleen Bo   Medical Decision Making: Nonspecific abdominal pain, without signs of complication from relatively recent bilateral tubal ligation procedure.  No evidence for acute intra-abdominal process, groin hernia, serious bacterial infection, or suspected traumatic injury to the right knee.  Patient stable for discharge with outpatient management.  Valerie Frost was evaluated in Emergency Department on 12/29/2019 for the symptoms described in the history of present illness. She was evaluated in the context of the global COVID-19 pandemic, which necessitated consideration that the patient might be at risk for infection with the SARS-CoV-2 virus that causes COVID-19. Institutional protocols and algorithms that pertain to the evaluation of patients at risk for COVID-19 are in a state of rapid change based on information released by regulatory bodies including the CDC and federal and state organizations. These policies and algorithms were followed during the patient's care in the ED.   CRITICAL CARE-no Performed by: Daleen Bo   Nursing Notes Reviewed/ Care Coordinated Applicable Imaging Reviewed Interpretation of Laboratory Data incorporated into ED treatment  The patient appears reasonably screened and/or stabilized for  discharge and I doubt any other medical condition or other [facility] requiring further screening, evaluation, or treatment in the ED at this time prior to discharge.  Plan: Home Medications-use  Tylenol for pain, narcotic if needed; Home Treatments-gradual advance diet activity; return here if the recommended treatment, does not improve the symptoms; Recommended follow up-PCP, prn    Final Clinical Impression(s) / ED Diagnoses Final diagnoses:  Generalized abdominal pain  Acute pain of right knee    Rx / DC Orders ED Discharge Orders         Ordered    traMADol (ULTRAM) 50 MG tablet  Every 6 hours PRN     12/29/19 2323           Mancel Bale, MD 12/29/19 2324

## 2019-12-29 NOTE — ED Triage Notes (Signed)
Patient here from home reporting abd pain near belly button radiating down into groin. Reports that she had a tubal ligation on Jan 15th. Denies n/v.

## 2019-12-30 ENCOUNTER — Ambulatory Visit: Payer: Self-pay

## 2019-12-30 NOTE — Telephone Encounter (Signed)
Patient called stating that she was in the ER for abdominal pain yesterday  They did a CT and injected contrast.  She states that 5-6 am she started to have burning pain in her head.  She states it is in the back of her head and sides of her head. Her vision is not blurred.She has not injured her head. She rates pain at 9.5. She has no fever. She has Hx of migraine one time but this pain is not the same. Per protocol patient will go back to ER for evaluation of severe head pain.  Care advice read to patient.  She verbalized understanding of all information.  Reason for Disposition . [1] SEVERE headache (e.g., excruciating) AND [2] "worst headache" of life  Answer Assessment - Initial Assessment Questions 1. LOCATION: "Where does it hurt?"      Burning back of head 2. ONSET: "When did the headache start?" (Minutes, hours or days)    6-7amt night 3. PATTERN: "Does the pain come and go, or has it been constant since it started?"    constant 4. SEVERITY: "How bad is the pain?" and "What does it keep you from doing?"  (e.g., Scale 1-10; mild, moderate, or severe)   - MILD (1-3): doesn't interfere with normal activities    - MODERATE (4-7): interferes with normal activities or awakens from sleep    - SEVERE (8-10): excruciating pain, unable to do any normal activities        9.5 5. RECURRENT SYMPTOM: "Have you ever had headaches before?" If so, ask: "When was the last time?" and "What happened that time?"      migains in past 6. CAUSE: "What do you think is causing the headache?"     CT yesterday 7. MIGRAINE: "Have you been diagnosed with migraine headaches?" If so, ask: "Is this headache similar?"     Not simular once 8. HEAD INJURY: "Has there been any recent injury to the head?"      No 9. OTHER SYMPTOMS: "Do you have any other symptoms?" (fever, stiff neck, eye pain, sore throat, cold symptoms)     Stiff neck 10. PREGNANCY: "Is there any chance you are pregnant?" "When was your last  menstrual period?"       No tubal ligation  Protocols used: HEADACHE-A-AH

## 2019-12-31 ENCOUNTER — Ambulatory Visit: Payer: BC Managed Care – PPO | Admitting: Physician Assistant

## 2021-06-06 IMAGING — CT CT ABD-PELV W/ CM
2 of 4 series · 16 of 46 positions shown, 18 images · IV contrast (omnipaque)
Comparison: CT abdomen pelvis dated 09/22/2019.

CLINICAL DATA: 33-year-old female with concern for abdominal
abscess or infection.

EXAM:
CT ABDOMEN AND PELVIS WITH CONTRAST
TECHNIQUE: Multidetector CT imaging of the abdomen and pelvis was performed
using the standard protocol following bolus administration of
intravenous contrast.
CONTRAST:  100mL OMNIPAQUE IOHEXOL 300 MG/ML  SOLN

[Series 2: axial st · axial · 0.71mm/px · z∈[-480,-80]mm · 13 of 92 slices shown, 15 images]
[im 6/92  soft-tissue]
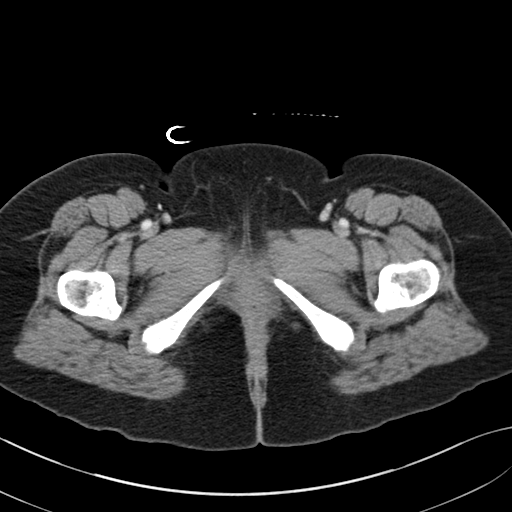
[im 6/92  bone]
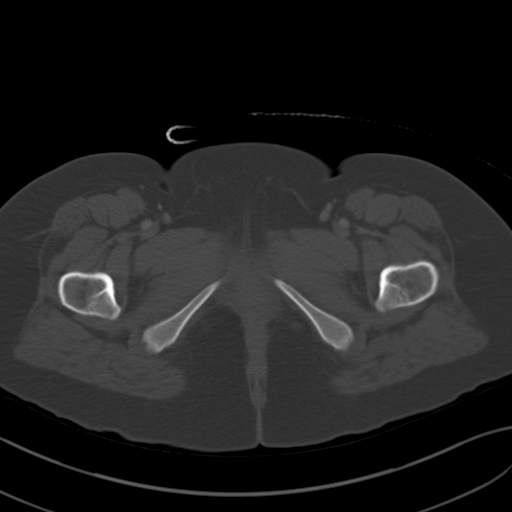
[im 11/92  soft-tissue]
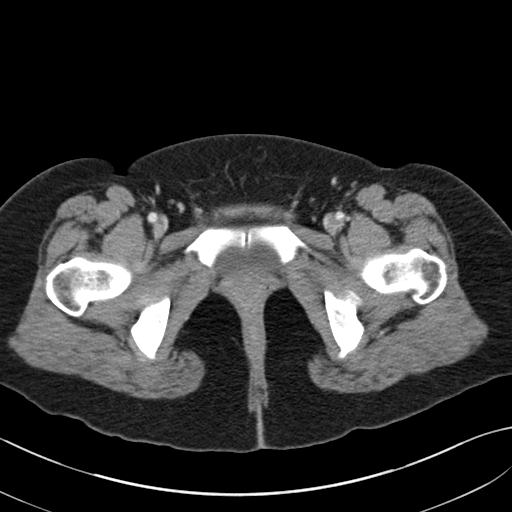
[im 22/92  soft-tissue]
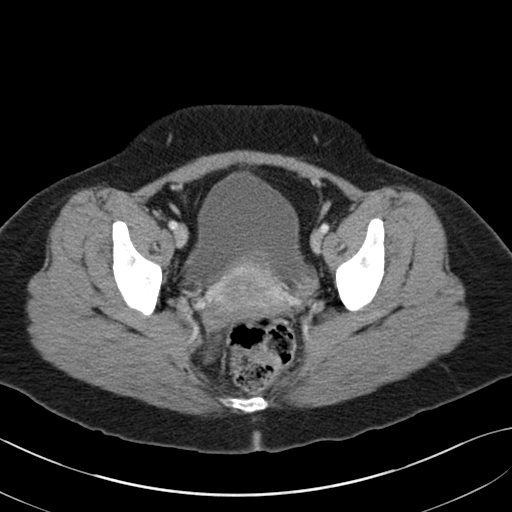
[im 27/92  soft-tissue]
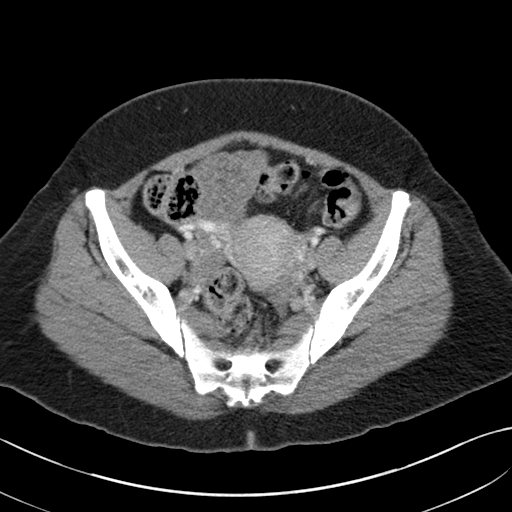
[im 33/92  soft-tissue]
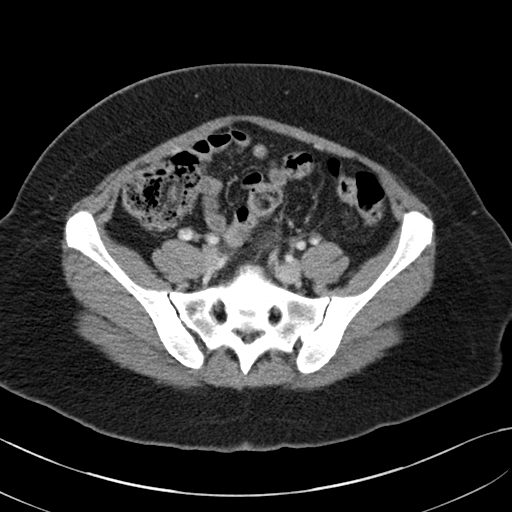
[im 38/92  soft-tissue]
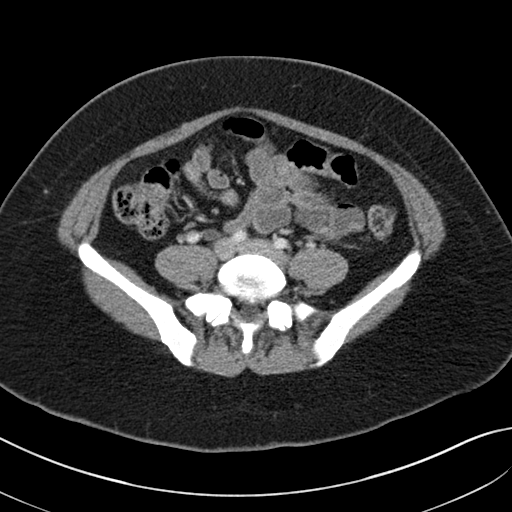
[im 49/92  soft-tissue]
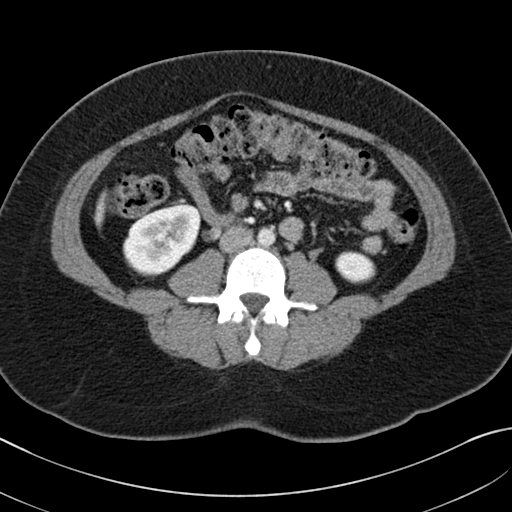
[im 54/92  soft-tissue]
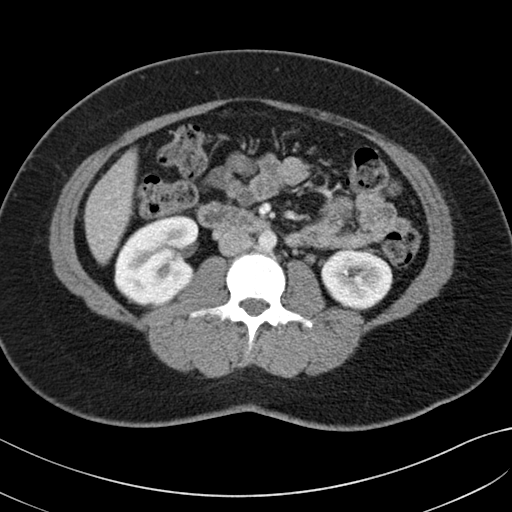
[im 59/92  soft-tissue]
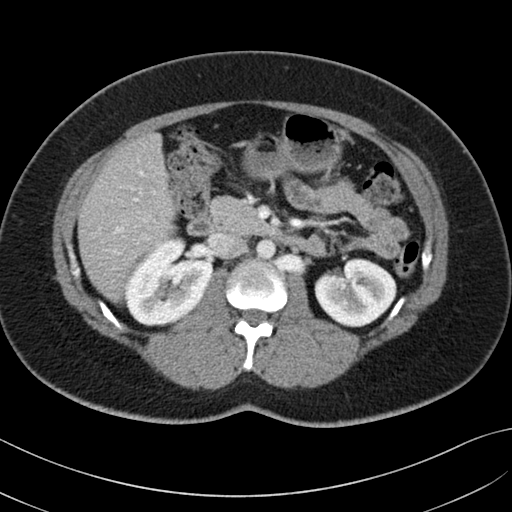
[im 59/92  bone]
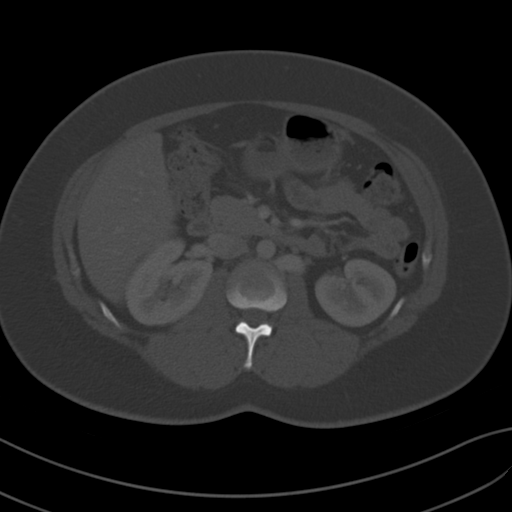
[im 65/92  soft-tissue]
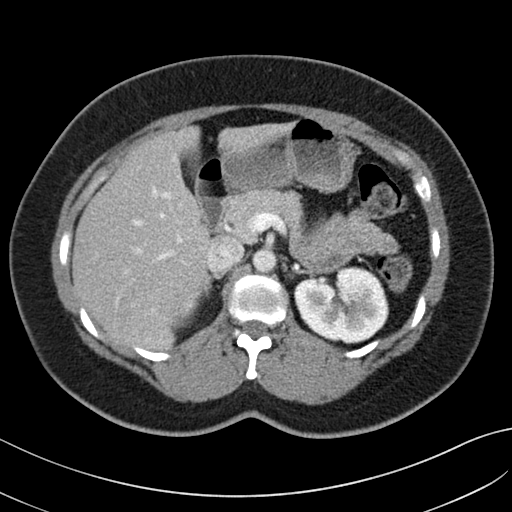
[im 70/92  soft-tissue]
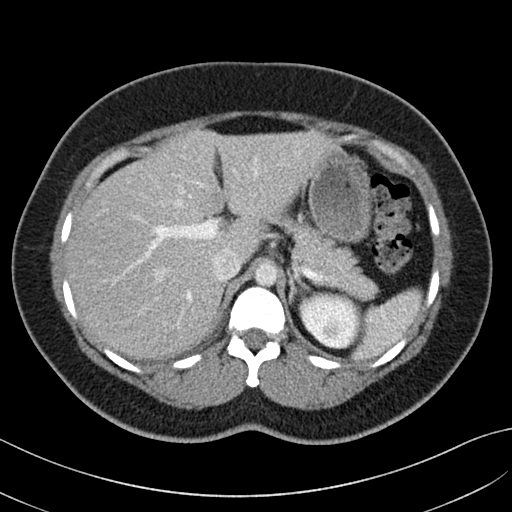
[im 81/92  soft-tissue]
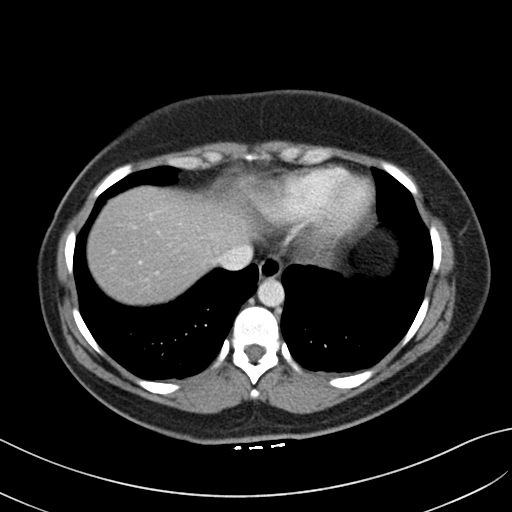
[im 86/92  soft-tissue]
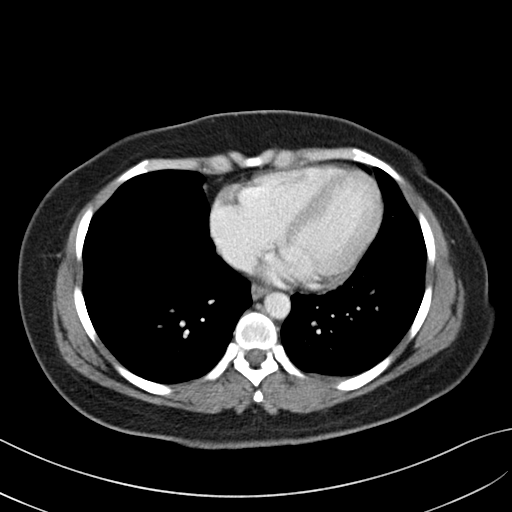

[Series 5: coronal st · coronal · 0.77mm/px · 3 of 143 slices shown]
[im 48/143  soft-tissue]
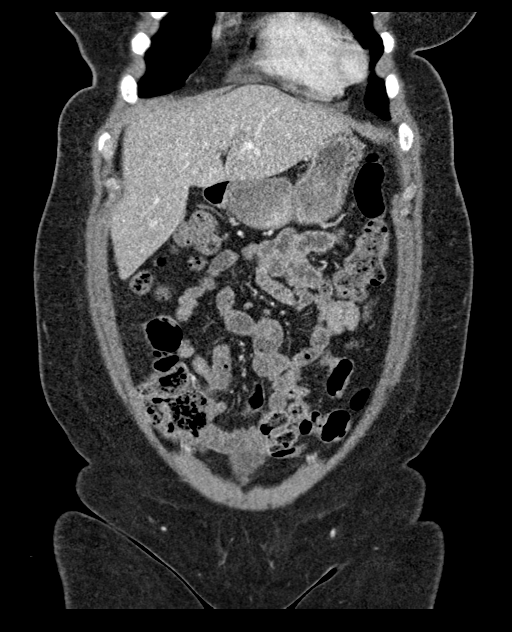
[im 64/143  soft-tissue]
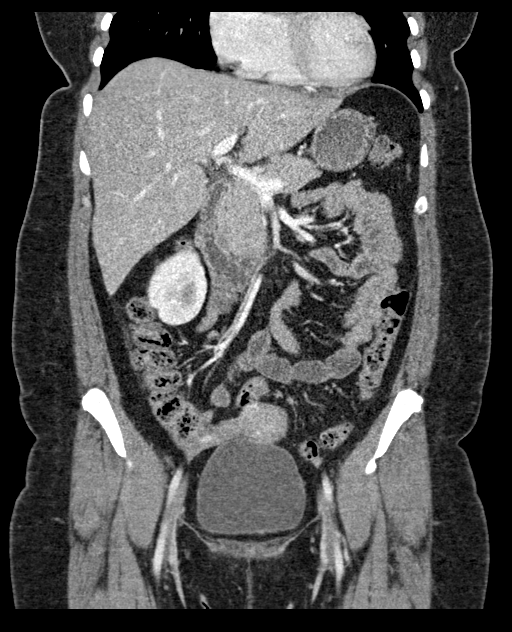
[im 79/143  soft-tissue]
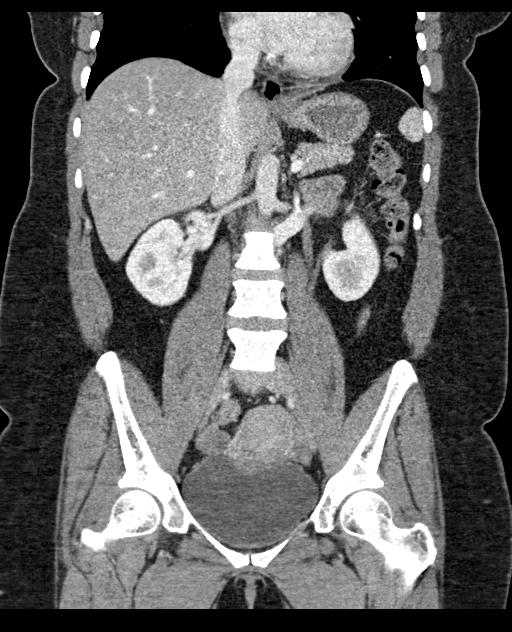

[16 of 46 positions shown; findings below may reference images not displayed]

FINDINGS: Lower chest: Partially visualized trace bilateral pleural effusions.
The visualized lung bases are clear.

No intra-abdominal free air or free fluid.

Hepatobiliary: The liver is unremarkable. No intrahepatic biliary
ductal dilatation. Cholecystectomy.

Pancreas: Unremarkable. No pancreatic ductal dilatation or
surrounding inflammatory changes.

Spleen: Normal in size without focal abnormality.

Adrenals/Urinary Tract: The adrenal glands are unremarkable. The
kidneys, visualized ureters, and urinary bladder appear unremarkable
as well.

Stomach/Bowel: There is moderate stool throughout the colon. There
is no bowel obstruction or active inflammation. The appendix is
normal as visualized.

Vascular/Lymphatic: The abdominal aorta and IVC are unremarkable.
Retroaortic left renal vein anatomy. No portal venous gas. There is
no adenopathy.

Reproductive: The uterus and ovaries are grossly unremarkable.

Other: Small fat containing umbilical hernia.

Musculoskeletal: No acute or significant osseous findings.
IMPRESSION: 1. No acute intra-abdominal or pelvic pathology. No bowel
obstruction. Normal appendix.
2. Partially visualized trace bilateral pleural effusions.
